# Patient Record
Sex: Female | Born: 1995 | Race: White | Hispanic: No | Marital: Single | Smoking: Never smoker
Health system: Southern US, Community
[De-identification: ages and names within clinical notes are randomized; demographics above are authoritative.]

---

## 2016-02-22 ENCOUNTER — Encounter (HOSPITAL_BASED_OUTPATIENT_CLINIC_OR_DEPARTMENT_OTHER): Payer: Self-pay | Admitting: Emergency Medicine

## 2016-02-22 ENCOUNTER — Emergency Department (HOSPITAL_BASED_OUTPATIENT_CLINIC_OR_DEPARTMENT_OTHER): Payer: 59

## 2016-02-22 ENCOUNTER — Emergency Department (HOSPITAL_BASED_OUTPATIENT_CLINIC_OR_DEPARTMENT_OTHER)
Admission: EM | Admit: 2016-02-22 | Discharge: 2016-02-22 | Disposition: A | Discharge status: Home / Self Care | Payer: 59 | Attending: Emergency Medicine | Admitting: Emergency Medicine

## 2016-02-22 DIAGNOSIS — J029 Acute pharyngitis, unspecified: Secondary | ICD-10-CM | POA: Diagnosis not present

## 2016-02-22 DIAGNOSIS — R0602 Shortness of breath: Secondary | ICD-10-CM | POA: Insufficient documentation

## 2016-02-22 DIAGNOSIS — R000 Tachycardia, unspecified: Secondary | ICD-10-CM

## 2016-02-22 DIAGNOSIS — R509 Fever, unspecified: Secondary | ICD-10-CM | POA: Insufficient documentation

## 2016-02-22 DIAGNOSIS — R079 Chest pain, unspecified: Secondary | ICD-10-CM | POA: Diagnosis not present

## 2016-02-22 LAB — COMPREHENSIVE METABOLIC PANEL
ALT: 33 U/L (ref 14–54)
AST: 24 U/L (ref 15–41)
Albumin: 3.5 g/dL (ref 3.5–5.0)
Alkaline Phosphatase: 48 U/L (ref 38–126)
Anion gap: 9 (ref 5–15)
BUN: 10 mg/dL (ref 6–20)
CHLORIDE: 99 mmol/L — AB (ref 101–111)
CO2: 25 mmol/L (ref 22–32)
CREATININE: 0.66 mg/dL (ref 0.44–1.00)
Calcium: 8.9 mg/dL (ref 8.9–10.3)
GFR calc Af Amer: >60 mL/min (ref >60)
Glucose, Bld: 106 mg/dL — ABNORMAL HIGH (ref 65–99)
Potassium: 3.7 mmol/L (ref 3.5–5.1)
SODIUM: 133 mmol/L — AB (ref 135–145)
Total Bilirubin: 0.4 mg/dL (ref 0.3–1.2)
Total Protein: 7.2 g/dL (ref 6.5–8.1)

## 2016-02-22 LAB — CBC WITH DIFFERENTIAL/PLATELET
Basophils Absolute: 0 10*3/uL (ref 0.0–0.1)
Basophils Relative: 0 %
EOS PCT: 0 %
Eosinophils Absolute: 0 10*3/uL (ref 0.0–0.7)
HCT: 37.6 % (ref 36.0–46.0)
Hemoglobin: 12.6 g/dL (ref 12.0–15.0)
LYMPHS ABS: 2.3 10*3/uL (ref 0.7–4.0)
LYMPHS PCT: 16 %
MCH: 29.4 pg (ref 26.0–34.0)
MCHC: 33.5 g/dL (ref 30.0–36.0)
MCV: 87.9 fL (ref 78.0–100.0)
MONO ABS: 1.6 10*3/uL — AB (ref 0.1–1.0)
Monocytes Relative: 11 %
Neutro Abs: 11 10*3/uL — ABNORMAL HIGH (ref 1.7–7.7)
Neutrophils Relative %: 73 %
PLATELETS: 294 10*3/uL (ref 150–400)
RBC: 4.28 MIL/uL (ref 3.87–5.11)
RDW: 13 % (ref 11.5–15.5)
WBC: 15 10*3/uL — ABNORMAL HIGH (ref 4.0–10.5)

## 2016-02-22 LAB — RAPID STREP SCREEN (MED CTR MEBANE ONLY): Streptococcus, Group A Screen (Direct): NEGATIVE

## 2016-02-22 LAB — URINALYSIS, ROUTINE W REFLEX MICROSCOPIC
Bilirubin Urine: NEGATIVE
GLUCOSE, UA: NEGATIVE mg/dL
Hgb urine dipstick: NEGATIVE
KETONES UR: NEGATIVE mg/dL
Leukocytes, UA: NEGATIVE
Nitrite: NEGATIVE
PH: 7 (ref 5.0–8.0)
Protein, ur: NEGATIVE mg/dL
Specific Gravity, Urine: 1.009 (ref 1.005–1.030)

## 2016-02-22 LAB — PREGNANCY, URINE: Preg Test, Ur: NEGATIVE

## 2016-02-22 LAB — I-STAT CG4 LACTIC ACID, ED: Lactic Acid, Venous: 1.62 mmol/L (ref 0.5–1.9)

## 2016-02-22 LAB — LIPASE, BLOOD: Lipase: 20 U/L (ref 11–51)

## 2016-02-22 MED ORDER — ACETAMINOPHEN 500 MG PO TABS
1000.0000 mg | ORAL_TABLET | Freq: Once | ORAL | Status: AC
  Administered 2016-02-22: 1000 mg via ORAL
  Filled 2016-02-22: qty 2

## 2016-02-22 MED ORDER — FLUCONAZOLE 200 MG PO TABS: 200.0000 mg | ORAL_TABLET | Freq: Every day | ORAL | 0 refills | Status: AC

## 2016-02-22 MED ORDER — CLINDAMYCIN PHOSPHATE 600 MG/50ML IV SOLN
600.0000 mg | Freq: Once | INTRAVENOUS | Status: AC
  Administered 2016-02-22: 600 mg via INTRAVENOUS
  Filled 2016-02-22: qty 50

## 2016-02-22 MED ORDER — IOPAMIDOL (ISOVUE-370) INJECTION 76%
100.0000 mL | Freq: Once | INTRAVENOUS | Status: AC | PRN
  Administered 2016-02-22: 100 mL via INTRAVENOUS

## 2016-02-22 MED ORDER — SODIUM CHLORIDE 0.9 % IV BOLUS (SEPSIS)
2000.0000 mL | Freq: Once | INTRAVENOUS | Status: AC
  Administered 2016-02-22: 2000 mL via INTRAVENOUS

## 2016-02-22 MED ORDER — CLINDAMYCIN HCL 150 MG PO CAPS: 450.0000 mg | ORAL_CAPSULE | Freq: Three times a day (TID) | ORAL | 0 refills | Status: AC

## 2016-02-22 MED ORDER — KETOROLAC TROMETHAMINE 30 MG/ML IJ SOLN
30.0000 mg | Freq: Once | INTRAMUSCULAR | Status: AC
  Administered 2016-02-22: 30 mg via INTRAVENOUS
  Filled 2016-02-22: qty 1

## 2016-02-22 NOTE — ED Provider Notes (Signed)
Emergency Department Provider Note   I have reviewed the triage vital signs and the nursing notes.   HISTORY  Chief Complaint Chest Pain   HPI Caitlin Moss is a 20 y.o. female with no significant PMH who presents to the ED for evaluation of sore throat, chest pain, dyspnea, and tachycardia. The patient reports recent URI symptoms and sore throat. She says that she was tested for strep and mono but was negative for both. When symptoms continued she was started on Doxycycline for 10 days with no change. She went to her PCP and was started on Levaquin yesterday and has taken only one dose so far.   This afternoon she developed sudden onset chest pain and dyspnea. She denies any radiation of the pain. No difficulty speaking or swallowing. She continues to have pain with swallowing. She does take an estrogen-containing birth control pill but has no other DVT risk factors.   History reviewed. No pertinent past medical history.  There are no active problems to display for this patient.   History reviewed. No pertinent surgical history.  Current Outpatient Rx  . Order #: [id number] Class: Historical Med  . Order #: [id number] Class: Print  . Order #: [id number] Class: Print    Allergies Penicillins and Eye drops [tetrahydrozoline]  History reviewed. No pertinent family history.  Social History Social History  Substance Use Topics  . Smoking status: Never Smoker  . Smokeless tobacco: Never Used  . Alcohol use No    Review of Systems  Constitutional: Positive fever/chills Eyes: No visual changes. ENT: Positive sore throat. Cardiovascular: Positive chest pain. Respiratory: Positive shortness of breath. Gastrointestinal: No abdominal pain.  No nausea, no vomiting.  No diarrhea.  No constipation. Genitourinary: Negative for dysuria. Musculoskeletal: Negative for back pain. Skin: Negative for rash. Neurological: Negative for headaches, focal weakness or numbness.  10-point  ROS otherwise negative.  ____________________________________________   PHYSICAL EXAM:  VITAL SIGNS: ED Triage Vitals  Enc Vitals Group     BP 02/22/16 1716 126/78     Pulse Rate 02/22/16 1716 (!) 148     Resp 02/22/16 1716 18     Temp 02/22/16 1716 98.7 F (37.1 C)     Temp Source 02/22/16 1716 Oral     SpO2 02/22/16 1716 100 %     Weight 02/22/16 1716 175 lb (79.4 kg)     Height 02/22/16 1716 5\' 10"  (1.778 m)     Pain Score 02/22/16 1730 8   Constitutional: Alert and oriented. Well appearing and in no acute distress. Eyes: Conjunctivae are normal.  Head: Atraumatic. Nose: No congestion/rhinnorhea. Mouth/Throat: Mucous membranes are dry. Oropharynx is erythematous with right tonsillar edema and exudate. No PTA. Managing oral secretions. No muffled voice.  Neck: No stridor.  No meningeal signs. Cardiovascular: Sinus tachycardia. Good peripheral circulation. Grossly normal heart sounds.   Respiratory: Normal respiratory effort.  No retractions. Lungs CTAB. Gastrointestinal: Soft and nontender. No distention.  Musculoskeletal: No lower extremity tenderness nor edema. No gross deformities of extremities. Neurologic:  Normal speech and language. No gross focal neurologic deficits are appreciated.  Skin:  Skin is warm, dry and intact. No rash noted.  ____________________________________________   LABS (all labs ordered are listed, but only abnormal results are displayed)  Labs Reviewed  COMPREHENSIVE METABOLIC PANEL - Abnormal; Notable for the following:       Result Value   Sodium 133 (*)    Chloride 99 (*)    Glucose, Bld 106 (*)    All other  components within normal limits  CBC WITH DIFFERENTIAL/PLATELET - Abnormal; Notable for the following:    WBC 15.0 (*)    Neutro Abs 11.0 (*)    Monocytes Absolute 1.6 (*)    All other components within normal limits  URINALYSIS, ROUTINE W REFLEX MICROSCOPIC (NOT AT [facility]) - Abnormal; Notable for the following:    APPearance  CLOUDY (*)    All other components within normal limits  RAPID STREP SCREEN (NOT AT [facility])  CULTURE, BLOOD (ROUTINE X 2)  CULTURE, BLOOD (ROUTINE X 2)  URINE CULTURE  CULTURE, GROUP A STREP [facility])  LIPASE, BLOOD  PREGNANCY, URINE  I-STAT CG4 LACTIC ACID, ED   ____________________________________________  EKG   EKG Interpretation  Date/Time:  Tuesday February 22 2016 17:21:44 EST Ventricular Rate:  153 PR Interval:  128 QRS Duration: 74 QT Interval:  250 QTC Calculation: 399 R Axis:   49 Text Interpretation:  Sinus tachycardia Nonspecific ST abnormality Abnormal ECG No STEMI.  Confirmed by LONG MD, JOSHUA [phone] on 02/22/2016 5:38:07 PM Also confirmed by LONG MD, JOSHUA [phone]), editor [place]  CCT, BEVERLY ([id number])  on 02/23/2016 7:23:58 AM       ____________________________________________  RADIOLOGY  Ct Angio Chest Pe W And/or Wo Contrast  Result Date: 02/22/2016 CLINICAL DATA:  20 year old female with pleuritic chest pain today. Tachycardia. Recently diagnosed with URI. Initial encounter. EXAM: CT ANGIOGRAPHY CHEST WITH CONTRAST TECHNIQUE: Multidetector CT imaging of the chest was performed using the standard protocol during bolus administration of intravenous contrast. Multiplanar CT image reconstructions and MIPs were obtained to evaluate the vascular anatomy. CONTRAST:  100 mL Isovue 370 COMPARISON:  None. FINDINGS: Cardiovascular: Good contrast bolus timing in the pulmonary arterial tree. No focal filling defect identified in the pulmonary arteries to suggest acute pulmonary embolism. Negative visualized aorta.  No pericardial effusion. Mediastinum/Nodes: Negative.  No lymphadenopathy. Lungs/Pleura: Major airways are patent. Diffuse pulmonary ground-glass opacity, with a mild dependent predominance. No areas of consolidation. No pleural effusion. Upper Abdomen: Generalized hepatic steatosis. Negative visualized spleen and gastric fundus. Musculoskeletal: No osseous  abnormality identified. Review of the MIP images confirms the above findings. IMPRESSION: 1. No evidence of acute pulmonary embolus. Negative visualized aorta. 2. Diffuse pulmonary ground-glass opacity is nonspecific but compatible with viral/atypical pulmonary infection (such as mycoplasma, adenovirus, influenza, etc). No consolidation or pleural effusion. 3. Fatty liver. Electronically Signed   By: Odessa FlemingH  Hall M.D.   On: 02/22/2016 19:03    ____________________________________________   PROCEDURES  Procedure(s) performed:   Procedures  None ____________________________________________   INITIAL IMPRESSION / ASSESSMENT AND PLAN / ED COURSE  Pertinent labs & imaging results that were available during my care of the patient were reviewed by me and considered in my medical decision making (see chart for details).  Patient presents to the ED with chest pain, dyspnea, and severe sinus tachycardia. She is currently treating a sore throat with a second round of antibiotics. No hypoxemia. Tachycardia may be explained by ongoing infection but with sudden onset chest pain and dyspnea I am more concerned regarding r/o of PE. She is on estrogen containing OCP. Will initiate sepsis order set and cover with Clindamycin given likely source of infection being the throat. Levaquin seems to provide suboptimal coverage in this clinical scenario. Will aggressively hydrate with IVF boluses.  09:30 PM Patient is feeling much better after IVF. HR down-trending nicely. CTA negative for PE. Does show diffuse ground glass opacity in lungs with no lobar infiltrate. Plan for Clindamycin at home  for pharyngeal infection. Will discontinue Levaquin (pt only taken one dose). Patient will follow up with PCP and return to the ED with any new or worsening symptoms.   At this time, I do not feel there is any life-threatening condition present. I have reviewed and discussed all results (EKG, imaging, lab, urine as appropriate),  exam findings with patient. I have reviewed nursing notes and appropriate previous records.  I feel the patient is safe to be discharged home without further emergent workup. Discussed usual and customary return precautions. Patient and family (if present) verbalize understanding and are comfortable with this plan.  Patient will follow-up with their primary care provider. If they do not have a primary care provider, information for follow-up has been provided to them. All questions have been answered.  ____________________________________________  FINAL CLINICAL IMPRESSION(S) / ED DIAGNOSES  Final diagnoses:  Chest pain, unspecified type  Tachycardia     MEDICATIONS GIVEN DURING THIS VISIT:  Medications  sodium chloride 0.9 % bolus 2,000 mL (0 mLs Intravenous Stopped 02/22/16 2136)  ketorolac (TORADOL) 30 MG/ML injection 30 mg (30 mg Intravenous Given 02/22/16 1813)  acetaminophen (TYLENOL) tablet 1,000 mg (1,000 mg Oral Given 02/22/16 1812)  clindamycin (CLEOCIN) IVPB 600 mg (0 mg Intravenous Stopped 02/22/16 2136)  iopamidol (ISOVUE-370) 76 % injection 100 mL (100 mLs Intravenous Contrast Given 02/22/16 1831)     NEW OUTPATIENT MEDICATIONS STARTED DURING THIS VISIT:  Discharge Medication List as of 02/22/2016  9:34 PM    START taking these medications   Details  clindamycin (CLEOCIN) 150 MG capsule Take 3 capsules (450 mg total) by mouth 3 (three) times daily., Starting Tue 02/22/2016, Until Tue 02/29/2016, Print        Note:  This document was prepared using Dragon voice recognition software and may include unintentional dictation errors.  Alona BeneJoshua Long, MD Emergency Medicine   Maia PlanJoshua G Long, MD 02/23/16 [phone]

## 2016-02-22 NOTE — ED Notes (Addendum)
2nd blood culture drawn and sent to lab

## 2016-02-22 NOTE — ED Notes (Signed)
One set of blood cultures drawn and sent to lab to hold.

## 2016-02-22 NOTE — Discharge Instructions (Signed)
You were seen in the ED with high heart rate and chest pain. We are starting you on antibiotics for the next week. Follow up with your PCP in the coming days. Return to the ED with any new or worsening symptoms such as return of chest pain, difficulty breathing, or inability to swallow.

## 2016-02-22 NOTE — ED Triage Notes (Signed)
Patient seen on 11/10 for URI at PCP, seen at PCP again today for recheck of symptoms. Patient states when she got home she rested and when she woke up her chest started hurting when she would take a deep breathe. Patient states it feels like she is being kicked in the chest. Patient tachycardic in triage, EKG completed. Patient is on birth control. Was sent by PCP to ER for evaluation to rule out PE.

## 2016-02-24 LAB — URINE CULTURE: Culture: NO GROWTH

## 2016-02-25 LAB — CULTURE, GROUP A STREP (THRC)

## 2016-02-27 LAB — CULTURE, BLOOD (ROUTINE X 2)
CULTURE: NO GROWTH
Culture: NO GROWTH

## 2017-09-26 IMAGING — CT CT ANGIO CHEST
2 of 8 series · 18 of 36 positions shown · IV contrast (isovue)
Comparison: None.

CLINICAL DATA: 20-year-old female with pleuritic chest pain today.
Tachycardia. Recently diagnosed with URI. Initial encounter.

EXAM:
CT ANGIOGRAPHY CHEST WITH CONTRAST
TECHNIQUE: Multidetector CT imaging of the chest was performed using the
standard protocol during bolus administration of intravenous
contrast. Multiplanar CT image reconstructions and MIPs were
obtained to evaluate the vascular anatomy.
CONTRAST:  100 mL Isovue 370

[Series 6: pe thins · axial · 0.71mm/px · z∈[-241,-45]mm · 17 of 220 slices shown]
[im 12/220  lung]
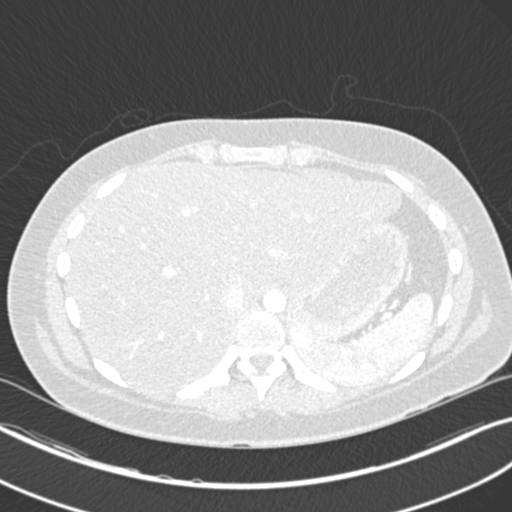
[im 24/220  mediastinal]
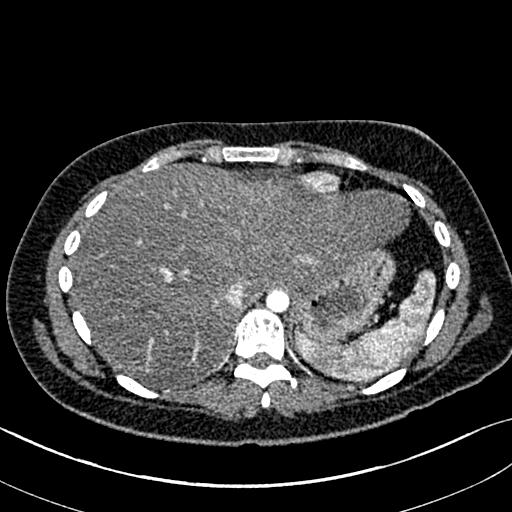
[im 35/220  lung]
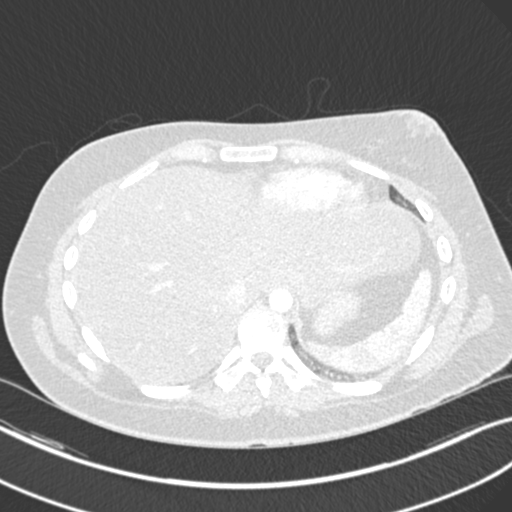
[im 47/220  mediastinal]
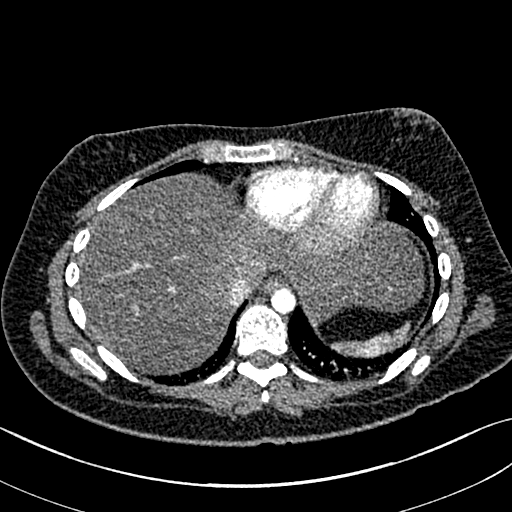
[im 58/220  lung]
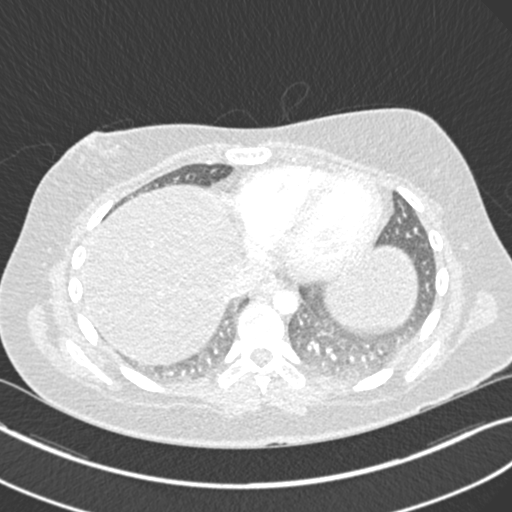
[im 70/220  mediastinal]
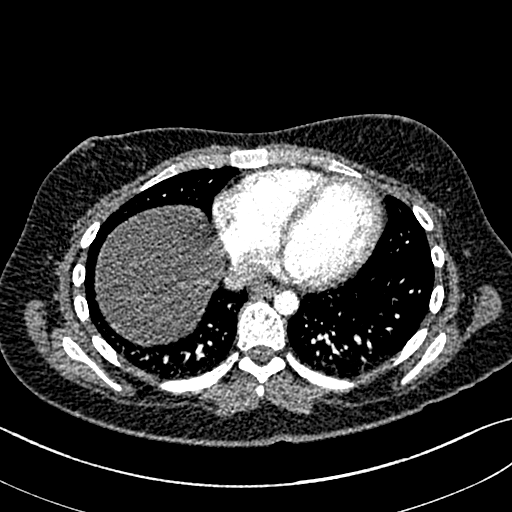
[im 81/220  lung]
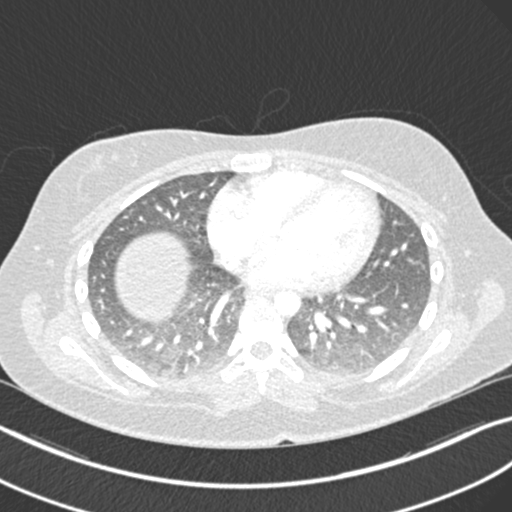
[im 93/220  mediastinal]
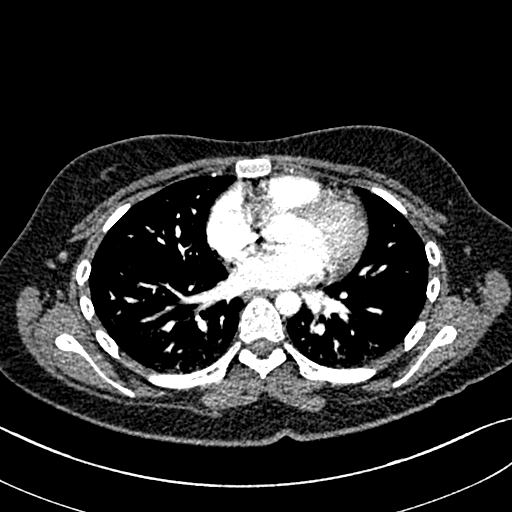
[im 116/220  lung]
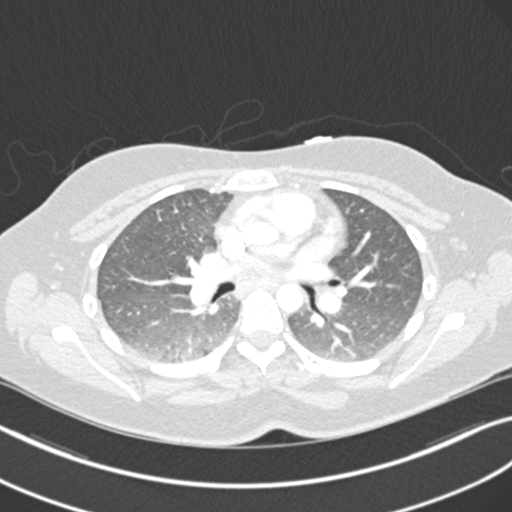
[im 127/220  mediastinal]
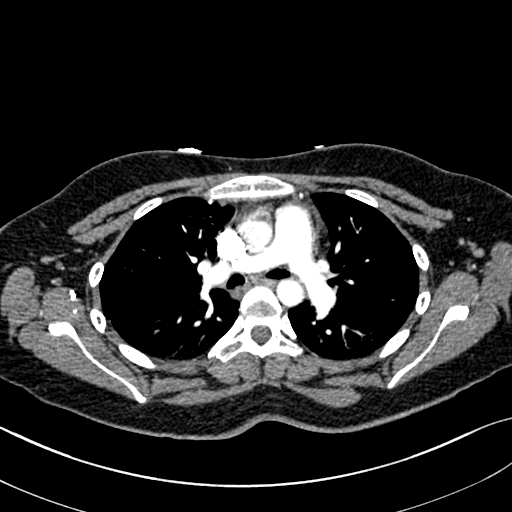
[im 139/220  lung]
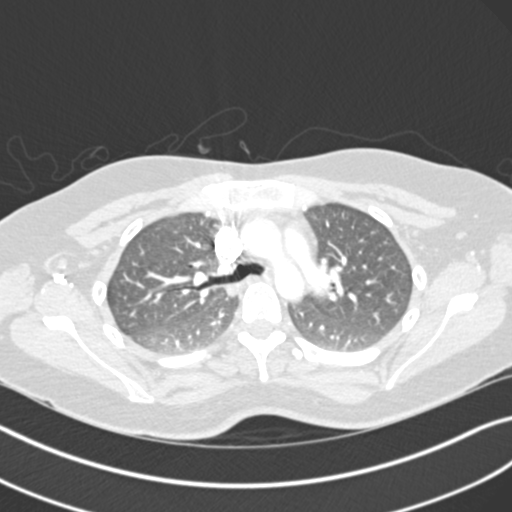
[im 150/220  mediastinal]
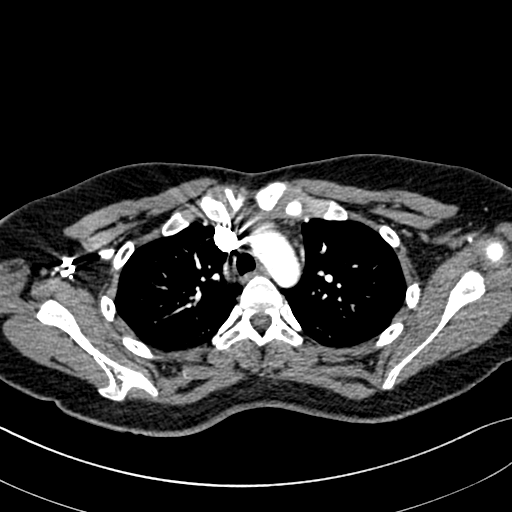
[im 162/220  lung]
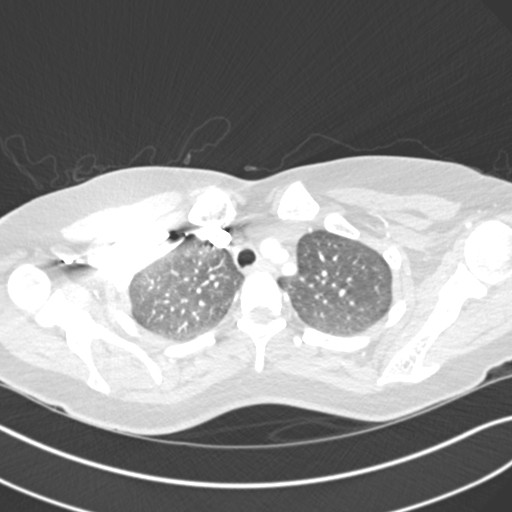
[im 173/220  mediastinal]
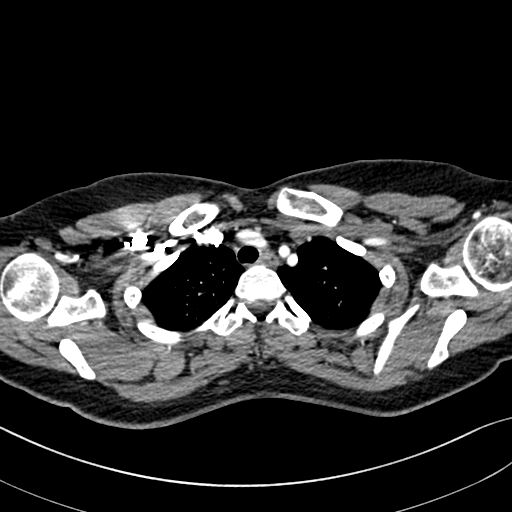
[im 185/220  lung]
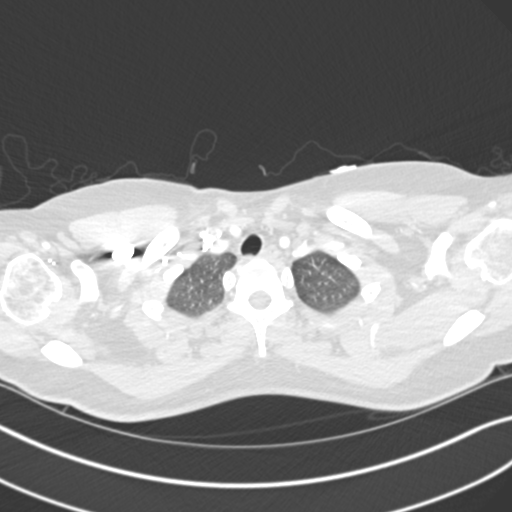
[im 196/220  mediastinal]
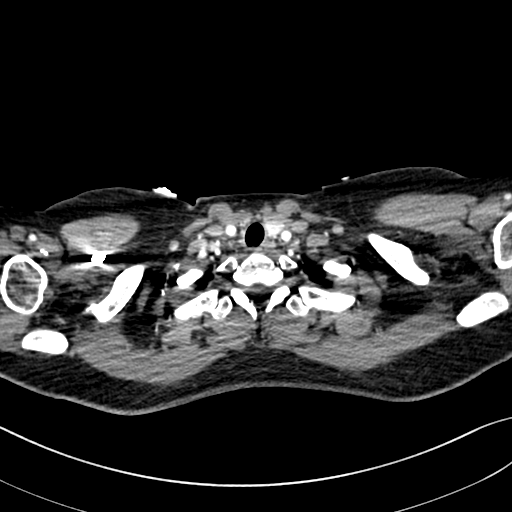
[im 208/220  lung]
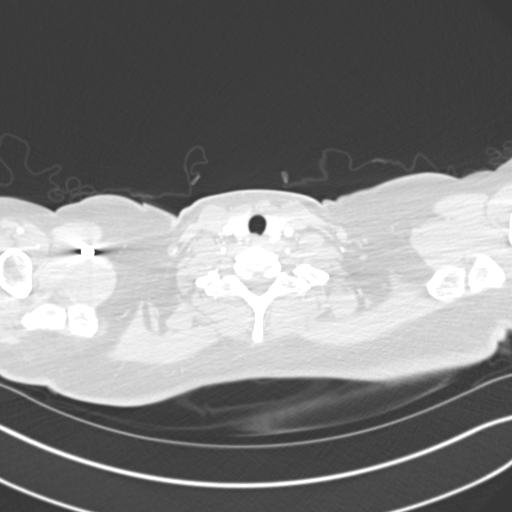

[Series 7: pe coronal mpr · coronal · 0.48mm/px · 1 of 97 slices shown]
[im 49/97  mediastinal]
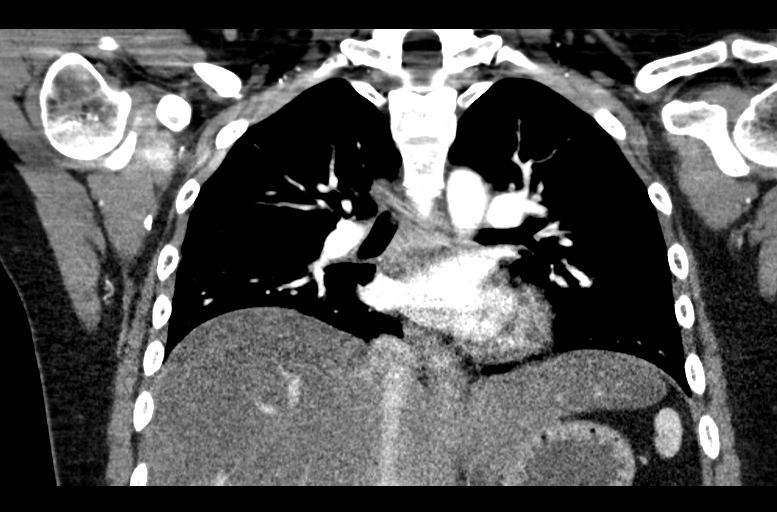

[18 of 36 positions shown; findings below may reference images not displayed]

FINDINGS: Cardiovascular: Good contrast bolus timing in the pulmonary arterial
tree.

No focal filling defect identified in the pulmonary arteries to
suggest acute pulmonary embolism.

Negative visualized aorta.  No pericardial effusion.

Mediastinum/Nodes: Negative.  No lymphadenopathy.

Lungs/Pleura: Major airways are patent. Diffuse pulmonary
ground-glass opacity, with a mild dependent predominance. No areas
of consolidation. No pleural effusion.

Upper Abdomen: Generalized hepatic steatosis. Negative visualized
spleen and gastric fundus.

Musculoskeletal: No osseous abnormality identified.

Review of the MIP images confirms the above findings.
IMPRESSION: 1. No evidence of acute pulmonary embolus. Negative visualized
aorta.
2. Diffuse pulmonary ground-glass opacity is nonspecific but
compatible with viral/atypical pulmonary infection (such as
mycoplasma, adenovirus, influenza, etc). No consolidation or pleural
effusion.
3. Fatty liver.

## 2021-10-05 ENCOUNTER — Ambulatory Visit: Payer: BC Managed Care – PPO | Admitting: Orthopedic Surgery

## 2021-10-05 ENCOUNTER — Encounter: Payer: Self-pay | Admitting: Orthopedic Surgery

## 2021-10-05 ENCOUNTER — Ambulatory Visit (INDEPENDENT_AMBULATORY_CARE_PROVIDER_SITE_OTHER): Payer: BC Managed Care – PPO

## 2021-10-05 DIAGNOSIS — M25512 Pain in left shoulder: Secondary | ICD-10-CM

## 2021-10-05 NOTE — Progress Notes (Signed)
Office Visit Note   Patient: Caitlin Moss           Date of Birth: 09/12/1995           MRN: [id number] Visit Date: 10/05/2021 Requested by: No referring provider defined for this encounter. 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  Denies any history of injury.  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  She states that this point shoulder pain will occasionally wake her from sleep at night.  She is right-hand dominant.  Denies any cervical spine pain or loss of motion.  Takes ibuprofen at times.  This does help a little bit.  She does do a lot of weight lifting about 5-6 times a week with shoulder workout 2 times a week.  She did take a 1 month break from working out and her symptoms remained.              ROS: All systems reviewed are negative as they relate to the chief complaint within the history of present illness.  Patient denies  fevers or chills.   Assessment & Plan: Visit Diagnoses:  1. Left shoulder pain, unspecified chronicity     Plan: Impression is left shoulder medial shoulder blade pain and crepitus.  Structurally the shoulder joint itself appears intact with good rotator cuff strength testing and no coarse grinding or crepitus.  Most of the clicking she is feeling is coming from the superior medial border of the scapula.  I think this is something that structurally we can observe.  Also talked about possible injection treatment if symptoms worsen.  In general it is a very difficult place to perform surgical treatment particularly for this mildly mechanical problem.  Encouraged her to had seated row training to her workout regimen to try to improve her scapular stabilizers.  Follow-up with [place] as needed.  I think in general highly wanted to make sure there is nothing structurally compelling for surgery prior to insurance  change.  Follow-Up Instructions: Return if symptoms worsen or fail to improve.   Orders:  Orders Placed This Encounter  Procedures   XR Shoulder Left   No orders of the defined types were placed in this encounter.     Procedures: No procedures performed   Clinical Data: No additional findings.  Objective: Vital Signs: There were no vitals taken for this visit.  Physical Exam:   Constitutional: Patient appears well-developed HEENT:  Head: Normocephalic Eyes:EOM are normal Neck: Normal range of motion Cardiovascular: Normal rate Pulmonary/chest: Effort normal Neurologic: Patient is alert Skin: Skin is warm Psychiatric: Patient has normal mood and affect   Ortho Exam: Ortho exam demonstrates full active and passive range of motion of the cervical spine and left shoulder.  She has 5 out of 5 grip EPL FPL interosseous resection extension bicep triceps and deltoid strength.  Radial pulse intact bilaterally.  Shoulder range of motion bilaterally passively 70/110/175.  Negative apprehension relocation testing.  Negative labral load testing.  1+ anterior posterior laxity with less than a centimeter sulcus sign.  O'Brien's testing negative bilaterally.  She does have tenderness at the medial superior border of the left scapula and she can make this area have crepitus with forward flexion and AB duction.  Radiographs unremarkable of the shoulder.  Specialty Comments:  No specialty  comments available.  Imaging: XR Shoulder Left  Result Date: 10/05/2021 AP axillary outlet radiographs left shoulder reviewed.  No acute fracture.  Acromiohumeral distance maintained.  No AC joint or glenohumeral joint arthritis.  Visualized lung fields clear.  Normal radiographs left shoulder    PMFS History: There are no problems to display for this patient.  History reviewed. No pertinent past medical history.  History reviewed. No pertinent family history.  History reviewed. No pertinent  surgical history. Social History   Occupational History   Not on file  Tobacco Use   Smoking status: Never   Smokeless tobacco: Never  Substance and Sexual Activity   Alcohol use: No   Drug use: No   Sexual activity: Not on file
